# Patient Record
Sex: Female | Born: 2005 | Race: White | Hispanic: Yes | Marital: Single | Smoking: Never smoker
Health system: Southern US, Community
[De-identification: ages and names within clinical notes are randomized; demographics above are authoritative.]

---
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
## 2025-03-27 ENCOUNTER — Ambulatory Visit
